# Patient Record
Sex: Male | Born: 1950 | Race: Black or African American | Hispanic: No | Marital: Married
Health system: Southern US, Community
[De-identification: ages and names within clinical notes are randomized; demographics above are authoritative.]

---

## 1999-01-30 ENCOUNTER — Emergency Department (HOSPITAL_COMMUNITY): Admission: EM | Admit: 1999-01-30 | Discharge: 1999-01-30 | Payer: Self-pay | Admitting: Endocrinology

## 2001-08-17 ENCOUNTER — Emergency Department (HOSPITAL_COMMUNITY): Admission: EM | Admit: 2001-08-17 | Discharge: 2001-08-17 | Payer: Self-pay

## 2001-12-20 ENCOUNTER — Encounter: Payer: Self-pay | Admitting: Emergency Medicine

## 2001-12-20 ENCOUNTER — Emergency Department (HOSPITAL_COMMUNITY): Admission: EM | Admit: 2001-12-20 | Discharge: 2001-12-20 | Payer: Self-pay | Admitting: Emergency Medicine

## 2001-12-22 ENCOUNTER — Emergency Department (HOSPITAL_COMMUNITY): Admission: EM | Admit: 2001-12-22 | Discharge: 2001-12-22 | Payer: Self-pay | Admitting: Emergency Medicine

## 2001-12-28 ENCOUNTER — Encounter: Payer: Self-pay | Admitting: Urology

## 2001-12-28 ENCOUNTER — Encounter: Admission: RE | Admit: 2001-12-28 | Discharge: 2001-12-28 | Payer: Self-pay | Admitting: Urology

## 2001-12-31 ENCOUNTER — Encounter: Payer: Self-pay | Admitting: Urology

## 2001-12-31 ENCOUNTER — Ambulatory Visit (HOSPITAL_BASED_OUTPATIENT_CLINIC_OR_DEPARTMENT_OTHER): Admission: RE | Admit: 2001-12-31 | Discharge: 2001-12-31 | Payer: Self-pay | Admitting: Urology

## 2002-01-17 ENCOUNTER — Ambulatory Visit (HOSPITAL_BASED_OUTPATIENT_CLINIC_OR_DEPARTMENT_OTHER): Admission: RE | Admit: 2002-01-17 | Discharge: 2002-01-17 | Payer: Self-pay | Admitting: Urology

## 2002-01-17 ENCOUNTER — Encounter: Payer: Self-pay | Admitting: Urology

## 2002-01-24 ENCOUNTER — Encounter: Admission: RE | Admit: 2002-01-24 | Discharge: 2002-01-24 | Payer: Self-pay | Admitting: Urology

## 2002-01-24 ENCOUNTER — Encounter: Payer: Self-pay | Admitting: Urology

## 2002-05-27 ENCOUNTER — Encounter (INDEPENDENT_AMBULATORY_CARE_PROVIDER_SITE_OTHER): Payer: Self-pay | Admitting: Specialist

## 2002-05-27 ENCOUNTER — Ambulatory Visit (HOSPITAL_BASED_OUTPATIENT_CLINIC_OR_DEPARTMENT_OTHER): Admission: RE | Admit: 2002-05-27 | Discharge: 2002-05-27 | Payer: Self-pay | Admitting: Orthopedic Surgery

## 2002-06-12 ENCOUNTER — Encounter: Admission: RE | Admit: 2002-06-12 | Discharge: 2002-07-10 | Payer: Self-pay | Admitting: Orthopedic Surgery

## 2008-03-10 ENCOUNTER — Emergency Department (HOSPITAL_COMMUNITY): Admission: EM | Admit: 2008-03-10 | Discharge: 2008-03-11 | Payer: Self-pay | Admitting: Emergency Medicine

## 2009-07-24 IMAGING — CT CT PELVIS W/O CM
1 of 2 series · 15 of 32 positions shown, 19 images · non-contrast
Comparison: Report of 12/20/2001.

CT ABDOMEN

CLINICAL DATA: RIGHT-SIDED PAIN.

CT ABDOMEN AND PELVIS WITHOUT CONTRAST (CT UROGRAM)
TECHNIQUE: Contiguous axial images of the abdomen and pelvis
without oral or intravenous contrast were obtained.

[Series 2: 160 stone 5.0 b40f st · axial · 0.71mm/px · z∈[-490,-90]mm · 15 of 88 slices shown, 19 images]
[im 4/88  soft-tissue]
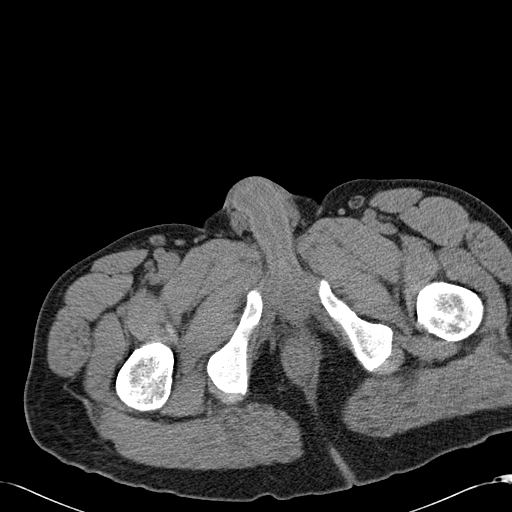
[im 4/88  bone]
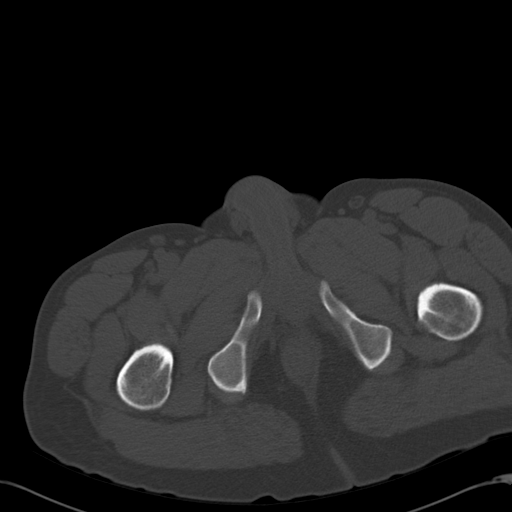
[im 12/88  soft-tissue]
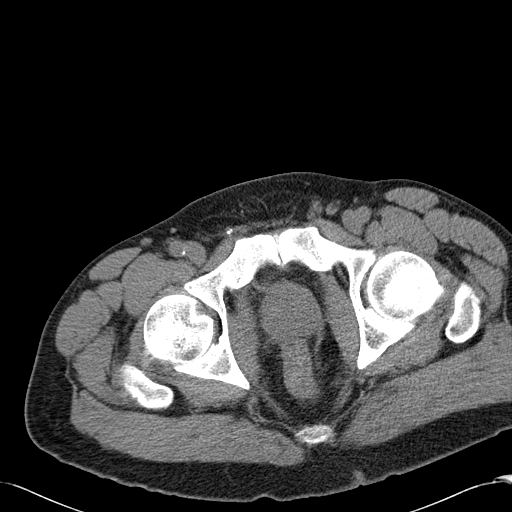
[im 19/88  soft-tissue]
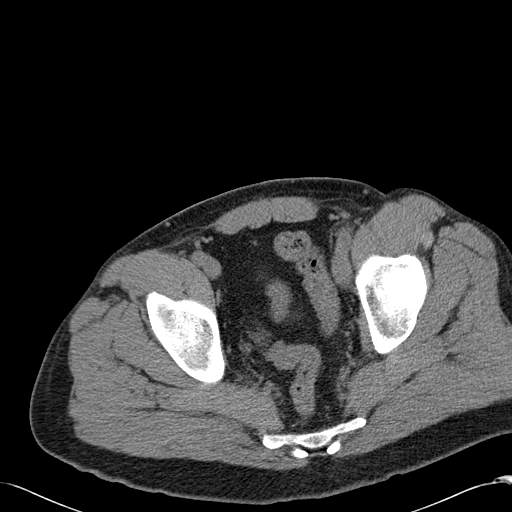
[im 23/88  soft-tissue]
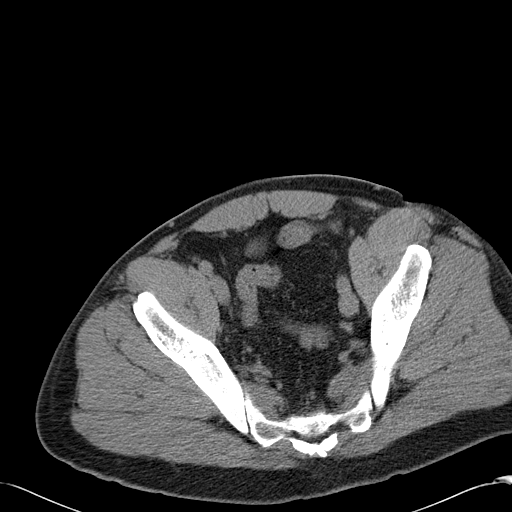
[im 31/88  soft-tissue]
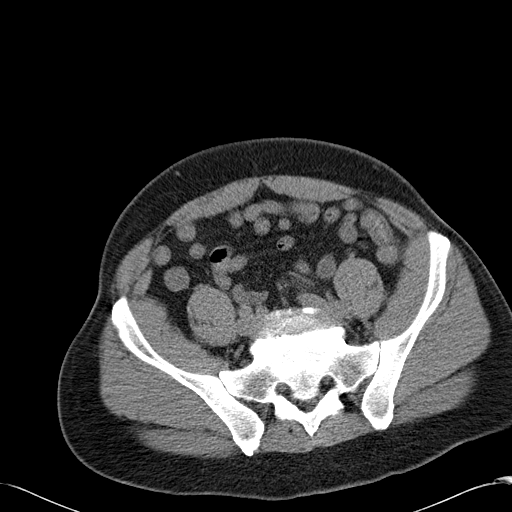
[im 38/88  soft-tissue]
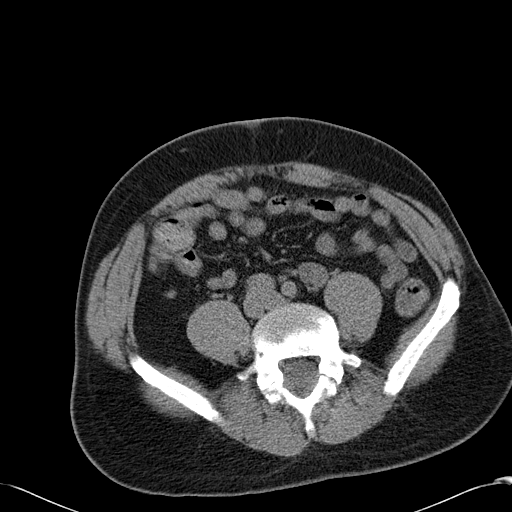
[im 46/88  soft-tissue]
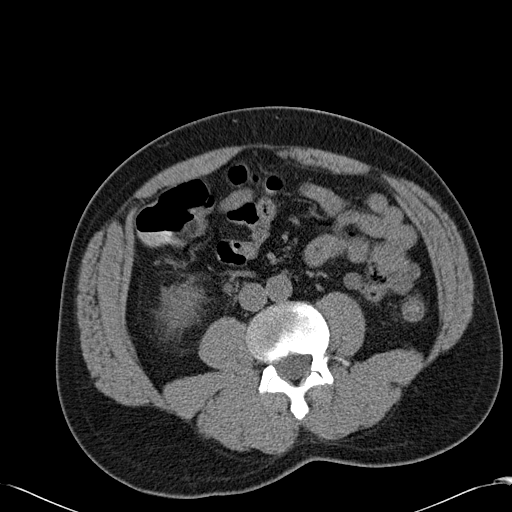
[im 50/88  soft-tissue]
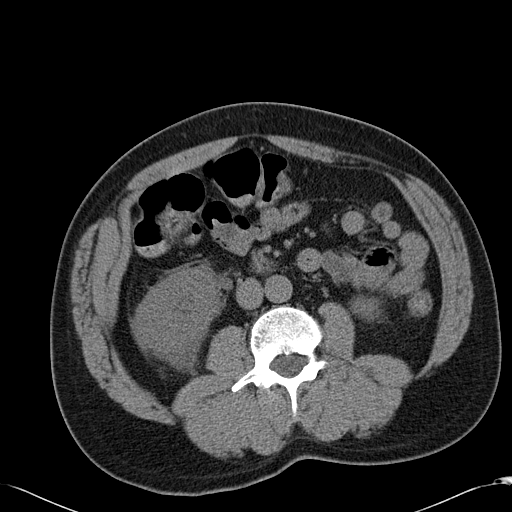
[im 57/88  soft-tissue]
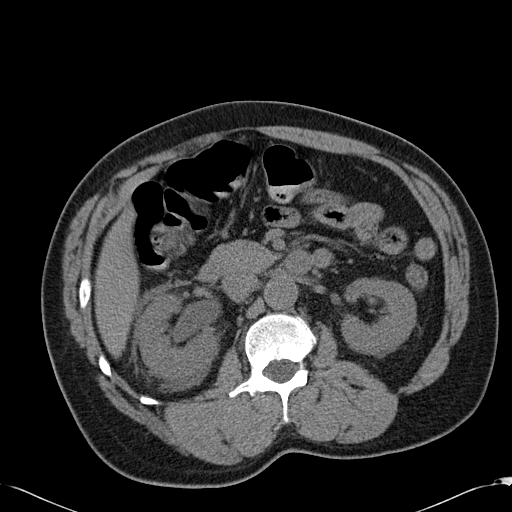
[im 57/88  bone]
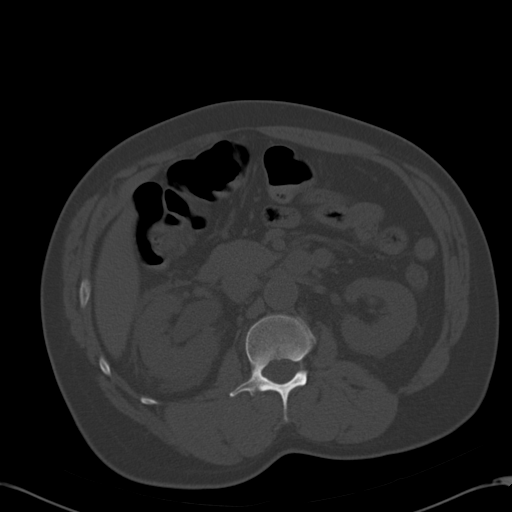
[im 65/88  soft-tissue]
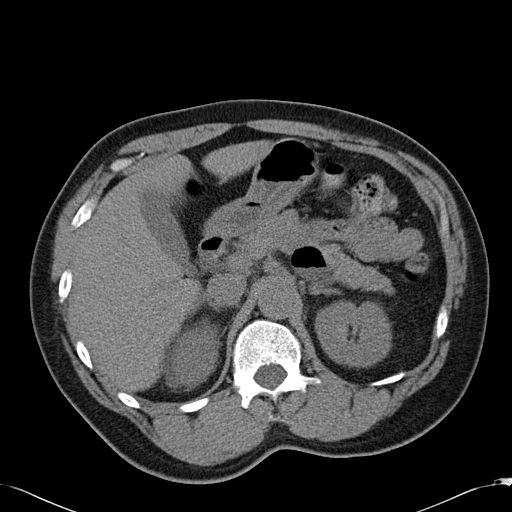
[im 69/88  soft-tissue]
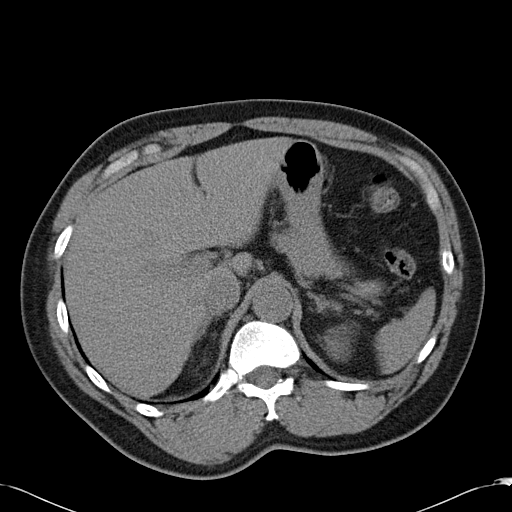
[im 72/88  lung]
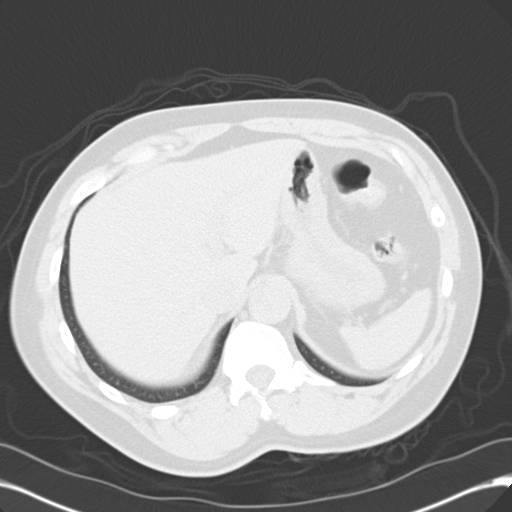
[im 76/88  soft-tissue]
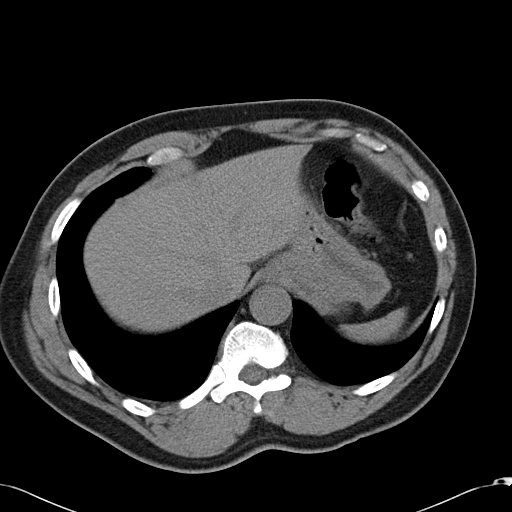
[im 76/88  lung]
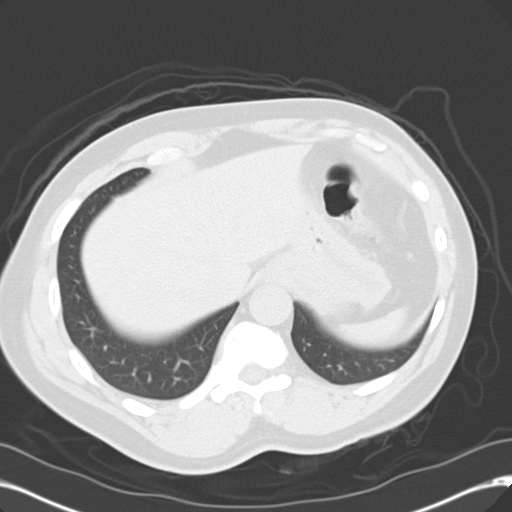
[im 80/88  lung]
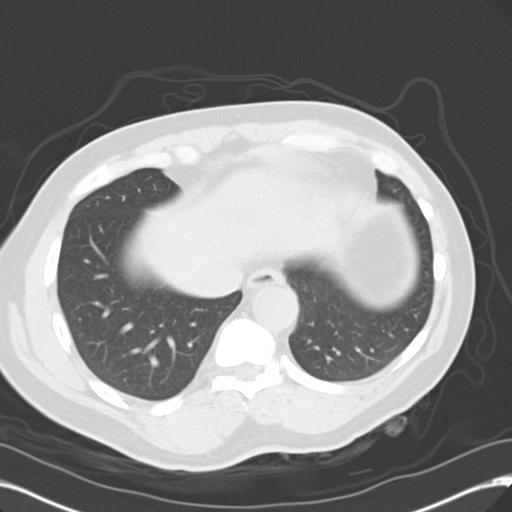
[im 84/88  soft-tissue]
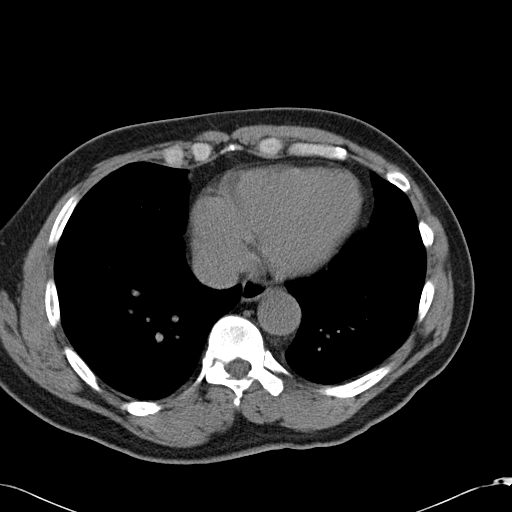
[im 84/88  lung]
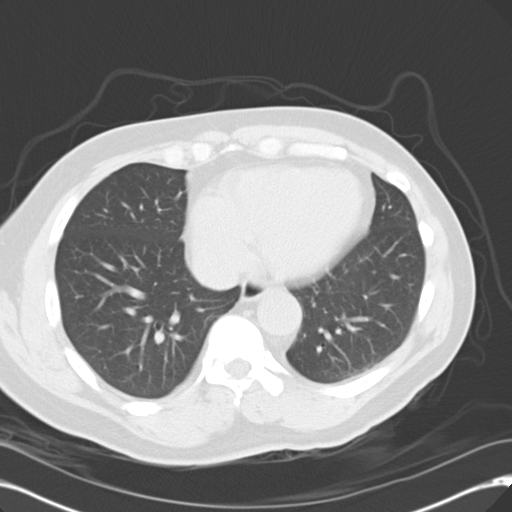

[15 of 32 positions shown; findings below may reference images not displayed]

FINDINGS: Exam is limited for evaluation of entities other than
urinary tract calculi due to lack of oral or intravenous contrast.

 Clear lung bases.  Normal heart size without pericardial or
pleural effusion.

Suspect sub centimeter hypoattenuating left liver lobe lesion on
image 15.  Most likely a small cyst.  Normal spleen, stomach,
pancreas, gallbladder, biliary tract.

Normal adrenal glands.

Interpolar right renal calculus measures 5 mm.  Moderate right-
sided obstructive signs secondary to a proximal right ureteric
calculus which measures 5 mm on axial image 49 and coronal image
46.

No left-sided urinary tract calculi. No retroperitoneal or
retrocrural adenopathy. Normal colon, appendix, and terminal ileum.

Normal abdominal small bowel without ascites.
IMPRESSION: 1. Proximal right ureteric calculus causing moderate obstructive
signs.
2.  Interpolar right renal calculus.
3.  Probable sub centimeter liver lesion.  Although incompletely
evaluated on this unenhanced CT, presuming no history of primary
malignancy, most likely a cyst.

CT PELVIS
FINDINGS: No distal urinary tract calculi.  Normal pelvic bowel
loops.  Normal urinary bladder.  Borderline prostatomegaly.
Bilateral pars defects at L5.  Grade 1 anterolisthesis of L5 on S1
with moderate degenerative disc disease at this level.
IMPRESSION: 1.  No distal urinary tract calculi or acute pelvic process.
2.  Bilateral pars defects at L5 with grade 1 anterolisthesis of L5
on S1.

## 2011-01-14 NOTE — Op Note (Signed)
[facility]  Patient:    Jon Proctor, Jon Proctor Visit Number: [id number] MRN: [id number]          Service Type: NES Location: [facility] Attending Physician:  Laqueta Jean Dictated by:   Vonzell Schlatter Patsi Sears, M.D. Proc. Date: 12/31/01 Admit Date:  12/31/2001                             Operative Report  PREOPERATIVE DIAGNOSIS:  Right mid ureteral calculus.  POSTOPERATIVE DIAGNOSIS:  Right mid ureteral calculus.  OPERATION: 1. Cystourethroscopy. 2. Right retrograde pyelogram. 3. Right ureteroscopy. 4. Basket extraction of right mid ureteral stone. 5. Right Double J catheter.  SURGEON:  Sigmund I. Patsi Sears, M.D.  ANESTHESIA:  General (LMA).  PREPARATION:  After appropriate preanesthesia, the patient is brought to the operating room and placed on the operating table in dorsal supine position where general (LMA) anesthesia was introduced. He was then replaced in the dorsal lithotomy position where the pubis was prepped with Betadine solution and draped in the usual fashion.  DESCRIPTION OF PROCEDURE:  Cystoscopy revealed a normal appearing bladder, with clear efflux from both orifices. Right retrograde pyelogram was performed which showed an apparent abnormality in the right mid ureter, but was poorly defined on retrograde. KUB taken just prior to surgery showed right mid ureteral calculus, as did CT scan. The patient underwent right ureteroscopy, with the finding of a stone in right mid ureter and basket extraction of the stone through the ureteroscope. The stone appeared to be stuck in the mid ureter, and a piece of ureteral mucosa was extracted with the stone. It was elected to place the Double J catheter, and this was accomplished by using a 6-French x 26-cm Double J catheter, and this was placed atraumatically without difficulty. The patient had a B&O suppository and the beginning of the case, and had Toradol at the end of the case. He was  awakened and taken to the recovery room in good condition. Dictated by:   Vonzell Schlatter Patsi Sears, M.D. Attending Physician:  Laqueta Jean DD:  12/31/01 TD:  12/31/01 Job: [id number]

## 2011-01-14 NOTE — Op Note (Signed)
Jon Proctor, SPATAFORE NO.:  [id number]   MEDICAL RECORD NO.:  [id number]                   PATIENT TYPE:  AMB   LOCATION:  [facility]                                  FACILITY:  [facility]   PHYSICIAN:  Robert A. Thurston Hole, M.D.              DATE OF BIRTH:  [birth year]   DATE OF PROCEDURE:  05/27/2002  DATE OF DISCHARGE:                                 OPERATIVE REPORT   PREOPERATIVE DIAGNOSES:  1. Right knee medial and lateral meniscal tears.  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  3. Right knee chondromalacia.  4. Rule out gout.   POSTOPERATIVE DIAGNOSES:  1. Right knee medial and lateral meniscal tears.  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  3. Right knee chondromalacia.  4. Rule out gout.   PROCEDURES:  1. Right knee examination under anesthesia followed by arthroscopic partial     medial and lateral meniscectomies.  2. Right knee chondroplasty.   SURGEON:  Elana Alm. Thurston Hole, M.D.   ASSISTANT:  Julien Girt, P.A.   ANESTHESIA:  General.   OPERATIVE FINDINGS:  30 minutes.   COMPLICATIONS:  None.   INDICATIONS FOR PROCEDURE:  The patient is a 60 year old gentleman who has  had significant right knee pain increasing in nature over the past six to  eight months with a previous history of ACL tear and reconstruction 25 years  ago.  He has had some persistent looseness in the knee, but more problems  with pain than instability.  X-ray exams and MRI have shown meniscal tearing  and ACL deficiency.  He is now to undergo arthroscopy.   DESCRIPTION OF PROCEDURE:  The patient was brought to the operating room on  May 27, 2002, and placed on the operative table in the supine  position.  After an adequate of general anesthesia was obtained, his right  knee was examined under anesthesia.  Range of motion from 0-130 degrees, 2-  3+ Lachman, positive pivot, and knee stable to valgus, varus, and posterior  stress with  normal patellar tracking.  The knee was sterilely injected with  0.25% Marcaine with epinephrine.  The right leg was prepped using sterile  DuraPrep and draped using sterile technique.  Originally through an  inferolateral portal the arthroscope with a pump attachment was placed.  Through an inferomedial portal an arthroscopic probe was placed.  On initial  inspection of the medial compartment, he was found to have a 50% grade 3  chondromalacia in the medial femoral condyle and medial tibial plateau,  which was debrided.  He also had diffuse crystalline deposits throughout the  articular cartilage and the meniscus and samples of this along with synovial  samples were taken for crystalline analysis and sent to pathology.  He also  had tearing of the medial meniscus.  Fifty percent of the posterior medial  horn was resected back to a stable rim.  The intracondylar  notch was  inspected.  The ACL was nonexistent with 5 mm of anterior laxity.  The  posterior cruciate was intact and stable.  The lateral compartment was  inspected.  There was 50% grade 3 chondromalacia which was debrided.  Further crystalline articular cartilage deposits were noted as well.  The  lateral meniscus was torn at the posterior and lateral horn, 30-40%, which  was resected back to a stable rim.  The patellofemoral joint showed 50%  grade 3 chondromalacia of 50% of the patella and patellofemoral groove,  which was debrided.  The patella tracked normally.  Moderate synovitis in  the medial and lateral gutters were debrided.  Otherwise they were free of  pathology.  After this was done, it was felt that all pathology had been  satisfactorily addressed.  The instruments were removed.  The portals were  closed with 3-0 nylon suture and injected with 0.25% Marcaine with  epinephrine and 4 mg of morphine.  Sterile dressings were applied.  The  patient was awaken and taken to the recovery room in stable condition.   FOLLOW-UP  CARE:  The patient will be followed as an outpatient on Vicodin  and Naprosyn.  We will see him back in the office in a week for sutures out  and follow-up.                                               Robert A. Thurston Hole, M.D.    RAW/MEDQ  D:  05/27/2002  T:  05/29/2002  Job:  [id number]

## 2011-03-04 ENCOUNTER — Emergency Department (HOSPITAL_COMMUNITY)
Admission: EM | Admit: 2011-03-04 | Discharge: 2011-03-05 | Disposition: A | Discharge status: Home / Self Care | Payer: 59 | Attending: Emergency Medicine | Admitting: Emergency Medicine

## 2011-03-04 DIAGNOSIS — R220 Localized swelling, mass and lump, head: Secondary | ICD-10-CM | POA: Insufficient documentation

## 2011-03-04 DIAGNOSIS — I10 Essential (primary) hypertension: Secondary | ICD-10-CM | POA: Insufficient documentation

## 2011-03-04 DIAGNOSIS — R221 Localized swelling, mass and lump, neck: Secondary | ICD-10-CM | POA: Insufficient documentation

## 2011-03-04 DIAGNOSIS — X58XXXA Exposure to other specified factors, initial encounter: Secondary | ICD-10-CM | POA: Insufficient documentation

## 2011-03-04 DIAGNOSIS — T783XXA Angioneurotic edema, initial encounter: Secondary | ICD-10-CM | POA: Insufficient documentation

## 2011-03-04 DIAGNOSIS — E7800 Pure hypercholesterolemia, unspecified: Secondary | ICD-10-CM | POA: Insufficient documentation

## 2011-03-04 DIAGNOSIS — Z79899 Other long term (current) drug therapy: Secondary | ICD-10-CM | POA: Insufficient documentation

## 2011-05-26 LAB — DIFFERENTIAL
Basophils Absolute: 0.1
Basophils Relative: 1
Eosinophils Absolute: 0.1
Eosinophils Relative: 2
Lymphocytes Relative: 16
Lymphs Abs: 1.4
Monocytes Absolute: 0.7
Monocytes Relative: 8
Neutro Abs: 6.5
Neutrophils Relative %: 74

## 2011-05-26 LAB — URINALYSIS, ROUTINE W REFLEX MICROSCOPIC
Protein, ur: 30 — AB
Urobilinogen, UA: 0.2

## 2011-05-26 LAB — URINE MICROSCOPIC-ADD ON

## 2011-05-26 LAB — POCT I-STAT, CHEM 8
BUN: 15
Calcium, Ion: 1.12
Chloride: 101
Creatinine, Ser: 1.4
Glucose, Bld: 118 — ABNORMAL HIGH
HCT: 44
Hemoglobin: 15
Potassium: 3.3 — ABNORMAL LOW
Sodium: 139
TCO2: 27

## 2011-05-26 LAB — CBC
HCT: 42.1
Hemoglobin: 13.8
MCHC: 32.7
MCV: 83.6
Platelets: 190
RBC: 5.03
RDW: 13.7
WBC: 8.8

## 2011-12-08 DIAGNOSIS — I152 Hypertension secondary to endocrine disorders: Secondary | ICD-10-CM | POA: Insufficient documentation

## 2013-12-12 DIAGNOSIS — F17210 Nicotine dependence, cigarettes, uncomplicated: Secondary | ICD-10-CM | POA: Insufficient documentation

## 2013-12-12 DIAGNOSIS — E785 Hyperlipidemia, unspecified: Secondary | ICD-10-CM | POA: Insufficient documentation

## 2018-06-20 DIAGNOSIS — Z0000 Encounter for general adult medical examination without abnormal findings: Secondary | ICD-10-CM | POA: Insufficient documentation

## 2019-10-25 ENCOUNTER — Ambulatory Visit: Payer: Medicare Other | Attending: Internal Medicine

## 2019-10-25 DIAGNOSIS — Z23 Encounter for immunization: Secondary | ICD-10-CM | POA: Insufficient documentation

## 2019-10-25 NOTE — Progress Notes (Signed)
   Covid-19 Vaccination Clinic  Name:  Deaundra Kutzer    MRN: [id number] DOB: 03/09/1951  10/25/2019  Mr. Paluch was observed post Covid-19 immunization for 15 minutes without incidence. He was provided with Vaccine Information Sheet and instruction to access the V-Safe system.   Mr. Knope was instructed to call 911 with any severe reactions post vaccine: Marland Kitchen Difficulty breathing  . Swelling of your face and throat  . A fast heartbeat  . A bad rash all over your body  . Dizziness and weakness    Immunizations Administered    Name Date Dose VIS Date Route   Pfizer COVID-19 Vaccine 10/25/2019 10:03 AM 0.3 mL 08/09/2019 Intramuscular   Manufacturer: [employer], [employer]   Lot: [id number]   NDC: [address]-3

## 2019-11-20 ENCOUNTER — Ambulatory Visit: Payer: Medicare Other | Attending: Internal Medicine

## 2019-11-20 DIAGNOSIS — Z23 Encounter for immunization: Secondary | ICD-10-CM

## 2019-11-20 NOTE — Progress Notes (Signed)
   Covid-19 Vaccination Clinic  Name:  Jon Proctor    MRN: [id number] DOB: Feb 03, 1951  11/20/2019  Mr. Jon Proctor was observed post Covid-19 immunization for 15 minutes without incident. He was provided with Vaccine Information Sheet and instruction to access the V-Safe system.   Mr. Jon Proctor was instructed to call 911 with any severe reactions post vaccine: Marland Kitchen Difficulty breathing  . Swelling of face and throat  . A fast heartbeat  . A bad rash all over body  . Dizziness and weakness   Immunizations Administered    Name Date Dose VIS Date Route   Pfizer COVID-19 Vaccine 11/20/2019  8:12 AM 0.3 mL 08/09/2019 Intramuscular   Manufacturer: [employer], [employer]   Lot: [id number]   NDC: [address]-6

## 2019-12-16 DIAGNOSIS — E663 Overweight: Secondary | ICD-10-CM | POA: Insufficient documentation

## 2022-04-21 DIAGNOSIS — N1831 Chronic kidney disease, stage 3a: Secondary | ICD-10-CM | POA: Insufficient documentation

## 2022-04-21 DIAGNOSIS — E119 Type 2 diabetes mellitus without complications: Secondary | ICD-10-CM | POA: Insufficient documentation

## 2023-08-21 DIAGNOSIS — R519 Headache, unspecified: Secondary | ICD-10-CM | POA: Insufficient documentation

## 2023-08-24 ENCOUNTER — Ambulatory Visit: Payer: Self-pay

## 2023-08-24 DIAGNOSIS — N529 Male erectile dysfunction, unspecified: Secondary | ICD-10-CM | POA: Insufficient documentation
